# Patient Record
Sex: Male | Born: 1985 | Race: White | Hispanic: No | Marital: Single | Smoking: Never smoker
Health system: Southern US, Community
[De-identification: ages and names within clinical notes are randomized; demographics above are authoritative.]

## PROBLEM LIST (undated history)

## (undated) DIAGNOSIS — F988 Other specified behavioral and emotional disorders with onset usually occurring in childhood and adolescence: Secondary | ICD-10-CM

---

## 2018-02-28 ENCOUNTER — Other Ambulatory Visit: Payer: Self-pay

## 2018-02-28 ENCOUNTER — Emergency Department (HOSPITAL_COMMUNITY): Payer: BLUE CROSS/BLUE SHIELD | Admitting: Certified Registered Nurse Anesthetist

## 2018-02-28 ENCOUNTER — Encounter (HOSPITAL_COMMUNITY): Payer: Self-pay | Admitting: Emergency Medicine

## 2018-02-28 ENCOUNTER — Emergency Department (HOSPITAL_COMMUNITY): Payer: BLUE CROSS/BLUE SHIELD

## 2018-02-28 ENCOUNTER — Ambulatory Visit (HOSPITAL_COMMUNITY)
Admission: EM | Admit: 2018-02-28 | Discharge: 2018-03-01 | Disposition: A | Discharge status: Home / Self Care | Payer: BLUE CROSS/BLUE SHIELD | Attending: Emergency Medicine | Admitting: Emergency Medicine

## 2018-02-28 ENCOUNTER — Encounter (HOSPITAL_COMMUNITY)
Admission: EM | Disposition: A | Discharge status: Home / Self Care | Payer: Self-pay | Source: Home / Self Care | Attending: Emergency Medicine

## 2018-02-28 DIAGNOSIS — S68624A Partial traumatic transphalangeal amputation of right ring finger, initial encounter: Secondary | ICD-10-CM | POA: Insufficient documentation

## 2018-02-28 DIAGNOSIS — S62636B Displaced fracture of distal phalanx of right little finger, initial encounter for open fracture: Secondary | ICD-10-CM | POA: Insufficient documentation

## 2018-02-28 DIAGNOSIS — S62632B Displaced fracture of distal phalanx of right middle finger, initial encounter for open fracture: Secondary | ICD-10-CM | POA: Insufficient documentation

## 2018-02-28 DIAGNOSIS — S62609B Fracture of unspecified phalanx of unspecified finger, initial encounter for open fracture: Secondary | ICD-10-CM | POA: Diagnosis present

## 2018-02-28 DIAGNOSIS — Y9389 Activity, other specified: Secondary | ICD-10-CM | POA: Diagnosis not present

## 2018-02-28 DIAGNOSIS — W3189XA Contact with other specified machinery, initial encounter: Secondary | ICD-10-CM | POA: Diagnosis not present

## 2018-02-28 HISTORY — PX: PERCUTANEOUS PINNING: SHX2209

## 2018-02-28 SURGERY — PINNING, EXTREMITY, PERCUTANEOUS
Anesthesia: General | Laterality: Right

## 2018-02-28 MED ORDER — ROCURONIUM BROMIDE 50 MG/5ML IV SOSY
PREFILLED_SYRINGE | INTRAVENOUS | Status: AC
  Filled 2018-02-28: qty 10

## 2018-02-28 MED ORDER — CEFAZOLIN SODIUM-DEXTROSE 2-4 GM/100ML-% IV SOLN
2.0000 g | Freq: Once | INTRAVENOUS | Status: AC
  Administered 2018-02-28: 2 g via INTRAVENOUS
  Filled 2018-02-28: qty 100

## 2018-02-28 MED ORDER — FENTANYL CITRATE (PF) 250 MCG/5ML IJ SOLN
INTRAMUSCULAR | Status: AC
  Filled 2018-02-28: qty 5

## 2018-02-28 MED ORDER — MIDAZOLAM HCL 5 MG/5ML IJ SOLN
INTRAMUSCULAR | Status: DC | PRN
  Administered 2018-02-28: 2 mg via INTRAVENOUS

## 2018-02-28 MED ORDER — HYDROMORPHONE HCL 1 MG/ML IJ SOLN: 0.2500 mg | INTRAMUSCULAR | Status: DC | PRN

## 2018-02-28 MED ORDER — LIDOCAINE 2% (20 MG/ML) 5 ML SYRINGE
INTRAMUSCULAR | Status: AC
  Filled 2018-02-28: qty 5

## 2018-02-28 MED ORDER — SUCCINYLCHOLINE CHLORIDE 20 MG/ML IJ SOLN
INTRAMUSCULAR | Status: DC | PRN
  Administered 2018-02-28: 140 mg via INTRAVENOUS

## 2018-02-28 MED ORDER — PROPOFOL 10 MG/ML IV BOLUS
INTRAVENOUS | Status: DC | PRN
  Administered 2018-02-28: 160 mg via INTRAVENOUS

## 2018-02-28 MED ORDER — FENTANYL CITRATE (PF) 250 MCG/5ML IJ SOLN
INTRAMUSCULAR | Status: DC | PRN
  Administered 2018-02-28: 100 ug via INTRAVENOUS

## 2018-02-28 MED ORDER — DIPHENHYDRAMINE HCL 50 MG/ML IJ SOLN
INTRAMUSCULAR | Status: DC | PRN
  Administered 2018-02-28: 12.5 mg via INTRAVENOUS

## 2018-02-28 MED ORDER — DEXAMETHASONE SODIUM PHOSPHATE 10 MG/ML IJ SOLN
INTRAMUSCULAR | Status: AC
  Filled 2018-02-28: qty 1

## 2018-02-28 MED ORDER — DEXAMETHASONE SODIUM PHOSPHATE 10 MG/ML IJ SOLN
INTRAMUSCULAR | Status: DC | PRN
  Administered 2018-02-28: 5 mg via INTRAVENOUS

## 2018-02-28 MED ORDER — LACTATED RINGERS IV SOLN
INTRAVENOUS | Status: DC | PRN
  Administered 2018-02-28: 22:00:00 via INTRAVENOUS

## 2018-02-28 MED ORDER — MORPHINE SULFATE (PF) 4 MG/ML IV SOLN
4.0000 mg | Freq: Once | INTRAVENOUS | Status: AC
  Administered 2018-02-28: 4 mg via INTRAVENOUS
  Filled 2018-02-28: qty 1

## 2018-02-28 MED ORDER — MEPERIDINE HCL 50 MG/ML IJ SOLN: 6.2500 mg | INTRAMUSCULAR | Status: DC | PRN

## 2018-02-28 MED ORDER — BUPIVACAINE HCL (PF) 0.25 % IJ SOLN
INTRAMUSCULAR | Status: AC
  Filled 2018-02-28: qty 30

## 2018-02-28 MED ORDER — MIDAZOLAM HCL 2 MG/2ML IJ SOLN
INTRAMUSCULAR | Status: AC
  Filled 2018-02-28: qty 2

## 2018-02-28 MED ORDER — EPHEDRINE 5 MG/ML INJ
INTRAVENOUS | Status: AC
  Filled 2018-02-28: qty 10

## 2018-02-28 MED ORDER — TETANUS-DIPHTH-ACELL PERTUSSIS 5-2.5-18.5 LF-MCG/0.5 IM SUSP
0.5000 mL | Freq: Once | INTRAMUSCULAR | Status: AC
  Administered 2018-02-28: 0.5 mL via INTRAMUSCULAR
  Filled 2018-02-28: qty 0.5

## 2018-02-28 MED ORDER — PROPOFOL 10 MG/ML IV BOLUS
INTRAVENOUS | Status: AC
  Filled 2018-02-28: qty 20

## 2018-02-28 MED ORDER — ONDANSETRON HCL 4 MG/2ML IJ SOLN
INTRAMUSCULAR | Status: DC | PRN
  Administered 2018-02-28: 4 mg via INTRAVENOUS

## 2018-02-28 MED ORDER — LACTATED RINGERS IV SOLN: INTRAVENOUS | Status: DC

## 2018-02-28 MED ORDER — 0.9 % SODIUM CHLORIDE (POUR BTL) OPTIME
TOPICAL | Status: DC | PRN
  Administered 2018-02-28: 1000 mL

## 2018-02-28 MED ORDER — SODIUM CHLORIDE 0.9 % IV BOLUS
500.0000 mL | Freq: Once | INTRAVENOUS | Status: AC
  Administered 2018-02-28: 500 mL via INTRAVENOUS

## 2018-02-28 MED ORDER — PROMETHAZINE HCL 25 MG/ML IJ SOLN: 6.2500 mg | INTRAMUSCULAR | Status: DC | PRN

## 2018-02-28 MED ORDER — PHENYLEPHRINE 40 MCG/ML (10ML) SYRINGE FOR IV PUSH (FOR BLOOD PRESSURE SUPPORT)
PREFILLED_SYRINGE | INTRAVENOUS | Status: AC
  Filled 2018-02-28: qty 10

## 2018-02-28 SURGICAL SUPPLY — 36 items
BANDAGE ACE 4X5 VEL STRL LF (GAUZE/BANDAGES/DRESSINGS) ×3 IMPLANT
BNDG CMPR 9X4 STRL LF SNTH (GAUZE/BANDAGES/DRESSINGS)
BNDG COHESIVE 1X5 TAN STRL LF (GAUZE/BANDAGES/DRESSINGS) ×3 IMPLANT
BNDG ESMARK 4X9 LF (GAUZE/BANDAGES/DRESSINGS) IMPLANT
BNDG GAUZE ELAST 4 BULKY (GAUZE/BANDAGES/DRESSINGS) ×15 IMPLANT
CORDS BIPOLAR (ELECTRODE) IMPLANT
CUFF TOURNIQUET SINGLE 18IN (TOURNIQUET CUFF) ×3 IMPLANT
CUFF TOURNIQUET SINGLE 24IN (TOURNIQUET CUFF) IMPLANT
DRAPE SURG 17X23 STRL (DRAPES) ×3 IMPLANT
GAUZE SPONGE 4X4 12PLY STRL (GAUZE/BANDAGES/DRESSINGS) ×9 IMPLANT
GAUZE SPONGE 4X4 12PLY STRL LF (GAUZE/BANDAGES/DRESSINGS) ×3 IMPLANT
GAUZE XEROFORM 1X8 LF (GAUZE/BANDAGES/DRESSINGS) ×3 IMPLANT
GLOVE SURG SYN 8.0 (GLOVE) ×3 IMPLANT
GOWN STRL REUS W/ TWL LRG LVL3 (GOWN DISPOSABLE) ×1 IMPLANT
GOWN STRL REUS W/ TWL XL LVL3 (GOWN DISPOSABLE) ×1 IMPLANT
GOWN STRL REUS W/TWL LRG LVL3 (GOWN DISPOSABLE) ×3
GOWN STRL REUS W/TWL XL LVL3 (GOWN DISPOSABLE) ×3
K-WIRE DBL END TROCAR 6X.062 (WIRE) ×3
K-WIRE FX150X2X2 TROC KRSH (WIRE) ×1
K-WIRE SMTH NS 9X.035 (WIRE) ×3 IMPLANT
K-WIRE TROCAR 2X150 (WIRE) ×3
KIT BASIN OR (CUSTOM PROCEDURE TRAY) ×3 IMPLANT
KIT TURNOVER KIT B (KITS) ×3 IMPLANT
KWIRE DBL END TROCAR 6X.062 (WIRE) ×1 IMPLANT
KWIRE FX150X2X2 TROC KRSH (WIRE) ×1 IMPLANT
NEEDLE HYPO 25GX1X1/2 BEV (NEEDLE) IMPLANT
NS IRRIG 1000ML POUR BTL (IV SOLUTION) ×3 IMPLANT
PACK ORTHO EXTREMITY (CUSTOM PROCEDURE TRAY) ×3 IMPLANT
PAD ARMBOARD 7.5X6 YLW CONV (MISCELLANEOUS) ×6 IMPLANT
PAD CAST 4YDX4 CTTN HI CHSV (CAST SUPPLIES) ×2 IMPLANT
PADDING CAST COTTON 4X4 STRL (CAST SUPPLIES) ×6
SYR CONTROL 10ML LL (SYRINGE) ×3 IMPLANT
TOWEL OR 17X24 6PK STRL BLUE (TOWEL DISPOSABLE) ×3 IMPLANT
TOWEL OR 17X26 10 PK STRL BLUE (TOWEL DISPOSABLE) ×3 IMPLANT
UNDERPAD 30X30 (UNDERPADS AND DIAPERS) ×3 IMPLANT
YANKAUER SUCT BULB TIP NO VENT (SUCTIONS) ×3 IMPLANT

## 2018-02-28 NOTE — ED Triage Notes (Signed)
Pt arrives to ED from home with complaints of lac to right fingers. (middle, ring, pinky fingers) from a lawn mower. EMS reports the pt was cleaning the shoot on the lawn mower with the engine turned on. Per Ems, the pts fingers arte mangled but nothing amputated. The pt put his hand in a bowl of ice when the accident happened. Bleeding is controlled. Pt placed in position of comfort with bed locked and lowered, call bell in reach.

## 2018-02-28 NOTE — Op Note (Signed)
Please see dictated report [phone]

## 2018-02-28 NOTE — Anesthesia Procedure Notes (Addendum)
Procedure Name: Intubation Date/Time: 02/28/2018 10:31 PM Performed by: Myna Bright, CRNA Pre-anesthesia Checklist: Patient identified, Emergency Drugs available, Suction available and Patient being monitored Patient Re-evaluated:Patient Re-evaluated prior to induction Oxygen Delivery Method: Circle System Utilized Preoxygenation: Pre-oxygenation with 100% oxygen Induction Type: IV induction Ventilation: Mask ventilation without difficulty Laryngoscope Size: Mac and 4 Grade View: Grade I Tube type: Oral Tube size: 7.5 mm Number of attempts: 1 Airway Equipment and Method: Stylet and Oral airway Placement Confirmation: ETT inserted through vocal cords under direct vision,  positive ETCO2 and breath sounds checked- equal and bilateral Secured at: 22 cm Tube secured with: Tape Dental Injury: Teeth and Oropharynx as per pre-operative assessment

## 2018-02-28 NOTE — Consult Note (Signed)
Reason for Consult:right hand lawnmower injury Referring Physician: long  Damon Dunlap is an 32 y.o. male.  HPI: patient's a very pleasant 32 year old right-hand-dominant male who placed his hand in the chute of a lawnmower while running and presents with obvious open injuries to the right long, ring, and small fingers.  History reviewed. No pertinent past medical history.  History reviewed. No pertinent surgical history.  No family history on file.  Social History:  has no tobacco, alcohol, and drug history on file.  Allergies: No Known Allergies  Medications: Scheduled:   No results found for this or any previous visit (from the past 48 hour(s)).  Dg Hand Complete Right  Result Date: 02/28/2018 CLINICAL DATA:  Lawnmower injury.  Finger lacerations. EXAM: RIGHT HAND - COMPLETE 3+ VIEW COMPARISON:  None. FINDINGS: There are partial amputation injuries through the 3rd, 4th and 5th digits. The long finger injury involves the base of the distal phalanx and demonstrates intra-articular extension with resulting anterior dislocation. The middle phalanx appears intact. The ring finger injury involves the head of the middle phalanx and the distal phalanx. The small finger injury involves only the tip of the distal phalanx. There is significant soft tissue injury, especially in the ring finger. No foreign bodies are seen. IMPRESSION: Partial amputation injuries of the 3rd, 4th and 5th digits as described, most severe in the ring finger. Electronically Signed   By: Carey BullocksWilliam  Veazey M.D.   On: 02/28/2018 19:28    Review of Systems  All other systems reviewed and are negative.  Blood pressure 117/79, pulse 61, temperature 97.8 F (36.6 C), temperature source Oral, resp. rate 16, height 5\' 10"  (1.778 m), weight 72.6 kg, SpO2 100 %. Physical Exam  Constitutional: He appears well-developed and well-nourished.  HENT:  Head: Normocephalic and atraumatic.  Neck: Normal range of motion.   Cardiovascular: Normal rate.  Respiratory: Effort normal.  Musculoskeletal:       Right hand: He exhibits tenderness, bony tenderness, disruption of two-point discrimination, deformity and laceration.  Right hand complex open injuries involving the long, ring, and small fingers with open distal phalangeal fractures and deformity  Skin: Skin is warm.  Psychiatric: He has a normal mood and affect. His behavior is normal. Judgment and thought content normal.    Assessment/Plan: 32 year old right-hand-dominant male with complex open injuries to his right long, ring, and small fingers status post lawnmower accident. I discussed with the patient the need for exploration and repair of necessary structures as an outpatient as soon as possible. Patient understands the risks and benefits and wishes to proceed.  Artist PaisMatthew A [facility] LibertyWeingold 02/28/2018, 8:42 PM

## 2018-02-28 NOTE — Anesthesia Preprocedure Evaluation (Addendum)
Anesthesia Evaluation  Patient identified by MRN, date of birth, ID band Patient awake    Reviewed: Allergy & Precautions, NPO status , Patient's Chart, lab work & pertinent test results  Airway Mallampati: II  TM Distance: >3 FB Neck ROM: Full    Dental  (+) Teeth Intact, Dental Advisory Given   Pulmonary neg pulmonary ROS,    breath sounds clear to auscultation       Cardiovascular negative cardio ROS   Rhythm:Regular Rate:Normal     Neuro/Psych negative neurological ROS  negative psych ROS   GI/Hepatic negative GI ROS, Neg liver ROS,   Endo/Other  negative endocrine ROS  Renal/GU negative Renal ROS     Musculoskeletal negative musculoskeletal ROS (+)   Abdominal Normal abdominal exam  (+)   Peds  Hematology negative hematology ROS (+)   Anesthesia Other Findings   Reproductive/Obstetrics                            Anesthesia Physical Anesthesia Plan  ASA: II  Anesthesia Plan: General   Post-op Pain Management:    Induction: Intravenous, Rapid sequence and Cricoid pressure planned  PONV Risk Score and Plan: 3 and Ondansetron, Dexamethasone and Midazolam  Airway Management Planned: Oral ETT  Additional Equipment: None  Intra-op Plan:   Post-operative Plan: Extubation in OR  Informed Consent: I have reviewed the patients History and Physical, chart, labs and discussed the procedure including the risks, benefits and alternatives for the proposed anesthesia with the patient or authorized representative who has indicated his/her understanding and acceptance.   Dental advisory given  Plan Discussed with: CRNA  Anesthesia Plan Comments:        Anesthesia Quick Evaluation

## 2018-02-28 NOTE — ED Provider Notes (Signed)
[facility] Provider Note   CSN: [id number] Arrival date & time: 02/28/18  1828     History   Chief Complaint Chief Complaint  Patient presents with  . Laceration    HPI Damon Dunlap is a [age] y.o. male presenting today for lacerations to his third fourth and fifth digits of his right hand.  Patient states that approximately 5 PM he was mowing and attempted to remove debris stuck in the chute while the motor was running.  Patient states that pain was immediate, sharp, constant and 10/10 in severity.  Patient called EMS who arrived and applied direct pressure to the wounds.  Bleeding controlled on arrival to the department.  Of note patient is right-hand dominant. Patient states last oral intake was approximately 2 PM today.   HPI  History reviewed. No pertinent past medical history.  There are no active problems to display for this patient.   History reviewed. No pertinent surgical history.      Home Medications    Prior to Admission medications   Not on File    Family History History reviewed. No pertinent family history.  Social History Social History   Tobacco Use  . Smoking status: Not on file  Substance Use Topics  . Alcohol use: Not on file  . Drug use: Not on file     Allergies   Patient has no known allergies.   Review of Systems Review of Systems  Constitutional: Negative.  Negative for chills and fever.  Gastrointestinal: Negative.  Negative for abdominal pain, blood in stool, diarrhea, nausea and vomiting.  Skin: Positive for wound.  Neurological: Positive for numbness. Negative for dizziness, weakness and headaches.  All other systems reviewed and are negative.    Physical Exam Updated Vital Signs BP 124/83   Pulse 72   Temp 97.8 F (36.6 C) (Oral)   Resp 14   Ht 5\' 10"  (1.778 m)   Wt 72.6 kg   SpO2 100%   BMI 22.96 kg/m   Physical Exam  Constitutional: He is oriented to person, place,  and time. He appears well-developed and well-nourished. No distress.  HENT:  Head: Normocephalic and atraumatic.  Right Ear: External ear normal.  Left Ear: External ear normal.  Nose: Nose normal.  Eyes: Pupils are equal, round, and reactive to light. EOM are normal.  Neck: Trachea normal and normal range of motion. No tracheal deviation present.  Cardiovascular: Normal rate, regular rhythm, normal heart sounds and intact distal pulses.  Pulmonary/Chest: Effort normal. No respiratory distress.  Abdominal: Soft. There is no tenderness. There is no rebound and no guarding.  Musculoskeletal: He exhibits deformity.  Patient with extensive injuries to the third/fourth/fifth fingers of the right hand as pictured below.  Sensation intact to third and fifth finger.  Patient describes numbness and tingling to the tip of the fourth finger, no sensation to light touch at the tip of fourth finger.  Patient is unwilling to move fingers due to pain.  Neurological: He is alert and oriented to person, place, and time.  Skin: Skin is warm and dry.  Psychiatric: He has a normal mood and affect. His behavior is normal.         ED Treatments / Results  Labs (all labs ordered are listed, but only abnormal results are displayed) Labs Reviewed - No data to display  EKG None  Radiology Dg Hand Complete Right  Result Date: 02/28/2018 CLINICAL DATA:  Lawnmower injury.  Finger lacerations.  EXAM: RIGHT HAND - COMPLETE 3+ VIEW COMPARISON:  None. FINDINGS: There are partial amputation injuries through the 3rd, 4th and 5th digits. The long finger injury involves the base of the distal phalanx and demonstrates intra-articular extension with resulting anterior dislocation. The middle phalanx appears intact. The ring finger injury involves the head of the middle phalanx and the distal phalanx. The small finger injury involves only the tip of the distal phalanx. There is significant soft tissue injury,  especially in the ring finger. No foreign bodies are seen. IMPRESSION: Partial amputation injuries of the 3rd, 4th and 5th digits as described, most severe in the ring finger. Electronically Signed   By: Carey BullocksWilliam  Veazey M.D.   On: 02/28/2018 19:28    Procedures Procedures (including critical care time)  Medications Ordered in ED Medications  morphine 4 MG/ML injection 4 mg (4 mg Intravenous Given 02/28/18 1908)  sodium chloride 0.9 % bolus 500 mL (0 mLs Intravenous Stopped 02/28/18 2008)  morphine 4 MG/ML injection 4 mg (4 mg Intravenous Given 02/28/18 2002)  ceFAZolin (ANCEF) IVPB 2g/100 mL premix (0 g Intravenous Stopped 02/28/18 2139)  Tdap (BOOSTRIX) injection 0.5 mL (0.5 mLs Intramuscular Given 02/28/18 2108)     Initial Impression / Assessment and Plan / ED Course  I have reviewed the triage vital signs and the nursing notes.  Pertinent labs & imaging results that were available during my care of the patient were reviewed by me and considered in my medical decision making (see chart for details).  Clinical Course as of Feb 28 2153  Fri Feb 28, 2018  2016 Consult called to hand, Dr. Mina MarbleWeingold, who is coming to evaluate the patient in the emergency department.   [BM]    Clinical Course User Index [BM] Bill SalinasMorelli, Brandon A, PA-C   Patient with open fractures to the third fourth and fifth fingers of the right hand.  Consult called to Dr. Mina MarbleWeingold who is taking the patient up to the OR for repair. Patient states that his last tetanus shot was over 15 years ago, Tdap provided here in department. 2 g of Ancef begun here in the emergency department. Patient states his last oral intake was at 2 PM today. Pain control and IV fluids provided in the emergency department.  Patient awaiting transport to OR.   Final Clinical Impressions(s) / ED Diagnoses   Final diagnoses:  Open fracture dislocation of distal interphalangeal (DIP) joint of finger    ED Discharge Orders    None         Elizabeth PalauMorelli, Brandon A, PA-C 02/28/18 2155    Maia PlanLong, Joshua G, MD 02/28/18 2332

## 2018-02-28 NOTE — ED Notes (Signed)
EMS gave 200 mcg fentanyl.  

## 2018-02-28 NOTE — ED Notes (Signed)
Pt uncle, Karis JubaRichard Alvarenga, for Emergency Contact, and to contact him when surgery is over, per pt.   [phone]

## 2018-03-01 ENCOUNTER — Encounter (HOSPITAL_COMMUNITY): Payer: Self-pay | Admitting: Orthopedic Surgery

## 2018-03-01 MED ORDER — BUPIVACAINE HCL (PF) 0.25 % IJ SOLN
INTRAMUSCULAR | Status: DC | PRN
  Administered 2018-02-28: 9 mL

## 2018-03-01 MED ORDER — AMOXICILLIN-POT CLAVULANATE 875-125 MG PO TABS: 1.0000 | ORAL_TABLET | Freq: Two times a day (BID) | ORAL | 0 refills | Status: AC

## 2018-03-01 MED ORDER — OXYCODONE-ACETAMINOPHEN 5-325 MG PO TABS: 1.0000 | ORAL_TABLET | ORAL | 0 refills | Status: DC | PRN

## 2018-03-01 NOTE — Transfer of Care (Signed)
Immediate Anesthesia Transfer of Care Note  Patient: Damon LeveringReece Dunlap  Procedure(s) Performed: REPAIR RIGHT OPEN FRACTURES, LONG AND SMALL FINGER, PLUS ANY NECESSARY PROCEDURES (Right )  Patient Location: PACU  Anesthesia Type:General  Level of Consciousness: drowsy  Airway & Oxygen Therapy: Patient Spontanous Breathing and Patient connected to nasal cannula oxygen  Post-op Assessment: Report given to RN and Post -op Vital signs reviewed and stable  Post vital signs: Reviewed and stable  Last Vitals:  Vitals Value Taken Time  BP 98/81 03/01/2018 12:02 AM  Temp    Pulse 79 03/01/2018 12:03 AM  Resp 15 03/01/2018 12:03 AM  SpO2 97 % 03/01/2018 12:03 AM  Vitals shown include unvalidated device data.  Last Pain:  Vitals:   02/28/18 2142  TempSrc:   PainSc: 3          Complications: No apparent anesthesia complications

## 2018-03-01 NOTE — Anesthesia Postprocedure Evaluation (Signed)
Anesthesia Post Note  Patient: [name] [name] Procedure(s) Performed: REPAIR RIGHT OPEN FRACTURES, LONG AND SMALL FINGER, PLUS ANY NECESSARY PROCEDURES (Right )     Patient location during evaluation: PACU Anesthesia Type: General Level of consciousness: awake and alert Pain management: pain level controlled Vital Signs Assessment: post-procedure vital signs reviewed and stable Respiratory status: spontaneous breathing, nonlabored ventilation, respiratory function stable and patient connected to nasal cannula oxygen Cardiovascular status: blood pressure returned to baseline and stable Postop Assessment: no apparent nausea or vomiting Anesthetic complications: no    Last Vitals:  Vitals:   03/01/18 0030 03/01/18 0045  BP: 129/67 119/73  Pulse: 83 88  Resp: 13 18  Temp:  36.6 C  SpO2: 100% 97%    Last Pain:  Vitals:   03/01/18 0045  TempSrc:   PainSc: 0-No pain                 Shelton SilvasKevin D Draxton Luu

## 2018-03-01 NOTE — Op Note (Signed)
NAMCarlos Dunlap: Rehm, Kross MEDICAL RECORD [id number] ACCOUNT [id number] DATE OF BIRTH:06-20-1986 FACILITY: [facility] LOCATION: [facility] PHYSICIAN:Braydee Shimkus A. Mina MarbleWEINGOLD, MD  OPERATIVE REPORT  DATE OF PROCEDURE:  02/28/2018  PREOPERATIVE DIAGNOSES:  Complex injuries right long, ring and small fingers.  POSTOPERATIVE DIAGNOSES:  Complex injuries right long, ring and small fingers.  PROCEDURE: 1.  Right long finger open treatment fracture dislocation distal interphalangeal joint with extensor tendon repair to his right ring finger. 2.  Middle phalangeal level amputation with fillet flap. 3.  Right small finger distal phalanx open fracture treatment.  SURGEON:  Dairl PonderMatthew Ryosuke Ericksen, MD  ASSISTANT:  None.  ANESTHESIA:  General.  COMPLICATIONS:  None.  DRAINS:  None.  DESCRIPTION OF PROCEDURE:  The patient was taken to the operating suite after induction of adequate general anesthetic.  The right upper extremity was prepped and draped in the usual sterile fashion.  An Esmarch was used to exsanguinate the limb, and the  tourniquet was inflated to 250 mmHg.  At this point in time, we thoroughly irrigated the long, ring and small fingers of the right hand after a complex open ____ injury.  One liter of normal saline was used to irrigate.  We removed bits of grass from  the wounds.  We then inspected the wounds.  The right long finger had a fracture dislocation of the distal interphalangeal joint that was open, as well as extensor tendon laceration proximally.  We thoroughly removed all nonviable foreign material from  the open fracture dislocation.  We reduced it, and we then sutured the skin edges with 4-0 Monocryl.  The extensor tendon was repaired with the same suture, and then under direct and fluoroscopic imaging, we placed a 0.035 K wire across the distal  phalanx into the middle phalanx to reduce the fracture dislocation.  The K wire was cut outside the skin.  The ring finger had a complex open  injury with comminution of the head of the middle phalanx and the base of the distal phalanx with a complex nail  bed laceration with soft tissue loss palmarly and over the germinal and sterile matrices.  We carefully excised the osseous fragments remaining in the nail bed to create a fillet flap that was used to advance palmarly over the tip of the ring finger.   We used a rongeur to smooth out the distal aspect of the middle phalanx.  We then performed primary wound closure of this fillet flap using 4-0 Monocryl.  The small finger had an open fracture of the distal phalanx, so we irrigated and treated with a  closed reduction.  We sutured the skin edges with 4-0 Monocryl.  At the end of the procedure, we dressed the wounds with Xeroform, 4 x 4's and compression wraps.  The patient tolerated these procedures well and went to recovery room in stable fashion.  LN/NUANCE  D:03/01/2018 T:03/01/2018 JOB:[id number]

## 2018-08-12 ENCOUNTER — Ambulatory Visit
Admission: EM | Admit: 2018-08-12 | Discharge: 2018-08-12 | Disposition: A | Discharge status: Home / Self Care | Payer: BLUE CROSS/BLUE SHIELD | Attending: Nurse Practitioner | Admitting: Nurse Practitioner

## 2018-08-12 DIAGNOSIS — H6982 Other specified disorders of Eustachian tube, left ear: Secondary | ICD-10-CM | POA: Insufficient documentation

## 2018-08-12 MED ORDER — FLUTICASONE PROPIONATE 50 MCG/ACT NA SUSP: 1.0000 | Freq: Every day | NASAL | 2 refills | Status: DC

## 2018-08-12 MED ORDER — CETIRIZINE HCL 10 MG PO TABS: 10.0000 mg | ORAL_TABLET | Freq: Every day | ORAL | 0 refills | Status: DC

## 2018-08-12 NOTE — ED Triage Notes (Signed)
Pt c/o lt ear pain and fullness for the past month

## 2018-08-12 NOTE — ED Provider Notes (Signed)
[facility]    CSN: [id number] Arrival date & time: 08/12/18  1451     History   Chief Complaint Chief Complaint  Patient presents with  . Otalgia    HPI Damon Dunlap is a 33 y.o. male.   Subjective:   Damon Dunlap is a 33 y.o. male who presents for left ear complaints. Symptoms include plugged sensation in the left ear and decreased hearing. Onset of symptoms was 1 month ago, unchanged since that time. Associated symptoms include left ear pressure.  He denies any ear pain or URI type symptoms.  He states that this happens from time to time when he is buildup of wax.  He had to have his ears cleaned out about a year ago.    The following portions of the patient's history were reviewed and updated as appropriate: allergies, current medications, past family history, past medical history, past social history, past surgical history and problem list.           History reviewed. No pertinent past medical history.  There are no active problems to display for this patient.   Past Surgical History:  Procedure Laterality Date  . PERCUTANEOUS PINNING Right 02/28/2018   Procedure: REPAIR RIGHT OPEN FRACTURES, LONG AND SMALL FINGER, PLUS ANY NECESSARY PROCEDURES;  Surgeon: Dairl Ponder, MD;  Location: [facility] OR;  Service: Orthopedics;  Laterality: Right;       Home Medications    Prior to Admission medications   Medication Sig Start Date End Date Taking? Authorizing Provider  cetirizine (ZYRTEC) 10 MG tablet Take 1 tablet (10 mg total) by mouth daily. 08/12/18   Lurline Idol, FNP  fluticasone (FLONASE) 50 MCG/ACT nasal spray Place 1 spray into both nostrils daily. 08/12/18   Lurline Idol, FNP    Family History No family history on file.  Social History Social History   Tobacco Use  . Smoking status: Never Smoker  . Smokeless tobacco: Never Used  Substance Use Topics  . Alcohol use: Not Currently  . Drug use: Not on file     Allergies     Patient has no known allergies.   Review of Systems Review of Systems  Constitutional: Negative.   HENT: Negative for ear discharge and ear pain.   Eyes: Negative.   Respiratory: Negative.   Cardiovascular: Negative.   All other systems reviewed and are negative.    Physical Exam Triage Vital Signs ED Triage Vitals [08/12/18 1500]  Enc Vitals Group     BP 126/81     Pulse Rate (!) 103     Resp 18     Temp 97.8 F (36.6 C)     Temp Source Oral     SpO2 97 %     Weight      Height      Head Circumference      Peak Flow      Pain Score 2     Pain Loc      Pain Edu?      Excl. in GC?    No data found.  Updated Vital Signs BP 126/81 (BP Location: Left Arm)   Pulse (!) 103   Temp 97.8 F (36.6 C) (Oral)   Resp 18   SpO2 97%   Visual Acuity Right Eye Distance:   Left Eye Distance:   Bilateral Distance:    Right Eye Near:   Left Eye Near:    Bilateral Near:     Physical Exam Constitutional:  Appearance: Normal appearance.  HENT:     Head: Normocephalic.     Right Ear: Hearing, tympanic membrane, ear canal and external ear normal.     Left Ear: Hearing, tympanic membrane, ear canal and external ear normal.     Nose: Nose normal.     Mouth/Throat:     Mouth: Mucous membranes are moist.     Pharynx: Oropharynx is clear.  Eyes:     Pupils: Pupils are equal, round, and reactive to light.  Neck:     Musculoskeletal: Normal range of motion and neck supple.  Cardiovascular:     Rate and Rhythm: Normal rate.  Pulmonary:     Effort: Pulmonary effort is normal.  Musculoskeletal: Normal range of motion.  Skin:    General: Skin is warm and dry.  Neurological:     General: No focal deficit present.     Mental Status: He is alert and oriented to person, place, and time.  Psychiatric:        Mood and Affect: Mood normal.      UC Treatments / Results  Labs (all labs ordered are listed, but only abnormal results are displayed) Labs Reviewed - No data  to display  EKG None  Radiology No results found.  Procedures Ear Cerumen Removal Date/Time: 08/12/2018 3:37 PM Performed by: Kallie LocksSchema, David W, RT Authorized by: Lurline IdolMurrill, Wyona Neils, FNP   Consent:    Consent obtained:  Verbal   Consent given by:  Patient   Risks discussed:  Bleeding, infection, pain, dizziness, incomplete removal and TM perforation   Alternatives discussed:  No treatment Procedure details:    Location:  R ear   Procedure type: irrigation   Post-procedure details:    Inspection:  TM intact   Hearing quality:  Normal   Patient tolerance of procedure:  Tolerated well, no immediate complications Comments:     Not much cerumen was there to be removed.  Patient felt that washing his ear out might help his symptoms so we obliged.   (including critical care time)  Medications Ordered in UC Medications - No data to display  Initial Impression / Assessment and Plan / UC Course  I have reviewed the triage vital signs and the nursing notes.  Pertinent labs & imaging results that were available during my care of the patient were reviewed by me and considered in my medical decision making (see chart for details).    33 year old male presenting with a one-month history of feeling as if his left ear is plugged with some pressure and decreased hearing.  Denies any other symptoms.  ENT exam relatively unremarkable.  Hearing is intact.  No cerumen impaction or buildup noted.  Patient ear was irrigated without any significant improvement in his symptoms.  Will treat for Eustachian tube dysfunction with Zyrtec and Flonase.  Advised to follow-up with ENT if symptoms persist or worsen.  Today's evaluation has revealed no signs of a dangerous process. Discussed diagnosis with patient. Patient aware of their diagnosis, possible red flag symptoms to watch out for and need for close follow up. Patient understands verbal and written discharge instructions. Patient comfortable with plan  and disposition.  Patient has a clear mental status at this time, good insight into illness (after discussion and teaching) and has clear judgment to make decisions regarding their care.  Documentation was completed with the aid of voice recognition software. Transcription may contain typographical errors. Final Clinical Impressions(s) / UC Diagnoses   Final diagnoses:  Eustachian tube  dysfunction, left     Discharge Instructions     Take medications as prescribed.  Follow-up with ENT as needed.    ED Prescriptions    Medication Sig Dispense Auth. Provider   fluticasone (FLONASE) 50 MCG/ACT nasal spray Place 1 spray into both nostrils daily. 16 g Lurline IdolMurrill, Rebecka Oelkers, FNP   cetirizine (ZYRTEC) 10 MG tablet Take 1 tablet (10 mg total) by mouth daily. 30 tablet Lurline IdolMurrill, Zebulin Siegel, FNP     Controlled Substance Prescriptions [place] Controlled Substance Registry consulted? Not Applicable   Lurline IdolMurrill, Aralynn Brake, [place]NP 08/12/18 1542

## 2018-08-12 NOTE — Discharge Instructions (Addendum)
Take medications as prescribed.  Follow-up with ENT as needed.

## 2018-11-17 ENCOUNTER — Emergency Department
Admission: EM | Admit: 2018-11-17 | Discharge: 2018-11-17 | Discharge status: Against Medical Advice | Payer: BLUE CROSS/BLUE SHIELD | Attending: Student in an Organized Health Care Education/Training Program | Admitting: Student in an Organized Health Care Education/Training Program

## 2018-11-17 ENCOUNTER — Encounter: Payer: Self-pay | Admitting: Emergency Medicine

## 2018-11-17 ENCOUNTER — Other Ambulatory Visit: Payer: Self-pay

## 2018-11-17 DIAGNOSIS — F909 Attention-deficit hyperactivity disorder, unspecified type: Secondary | ICD-10-CM | POA: Diagnosis not present

## 2018-11-17 DIAGNOSIS — R300 Dysuria: Secondary | ICD-10-CM

## 2018-11-17 DIAGNOSIS — Z79899 Other long term (current) drug therapy: Secondary | ICD-10-CM | POA: Insufficient documentation

## 2018-11-17 HISTORY — DX: Other specified behavioral and emotional disorders with onset usually occurring in childhood and adolescence: F98.8

## 2018-11-17 MED ORDER — DIAZEPAM 5 MG PO TABS
5.0000 mg | ORAL_TABLET | Freq: Once | ORAL | Status: AC
  Administered 2018-11-17: 5 mg via ORAL
  Filled 2018-11-17: qty 1

## 2018-11-17 NOTE — ED Triage Notes (Addendum)
Pt says he was laying in his bed and felt like "my bladder ruptured"; denies pain; voiding normal amount; denies frank blood in urine; denies penile discharge; pt adds he's nervous; was voiding before arrival and felt like something was running down his leg but there wasn't anything there; on the way here he felt like he was incontinent but was not

## 2018-11-17 NOTE — ED Triage Notes (Signed)
First nurse note: Pt walks briskly into speak with the police officer, states " I think my bladder ruptured, I think I peed myself, but my pants are dry"  Pt states to fist nurse that "i've been sitting on the toilet for the past hour and only a small amount came out"  Pt is unable to stand still, and is looking around frequently

## 2018-11-17 NOTE — Discharge Instructions (Addendum)
Please return to the ER or seek medical attention if you have recurrent symptoms or you change your mind about further evaluation.

## 2018-11-17 NOTE — ED Provider Notes (Signed)
[facility] Emergency Department Provider Note    First MD Initiated Contact with Patient 11/17/18 0148     (approximate)  I have reviewed the triage vital signs and the nursing notes.   HISTORY  Chief Complaint bladder issues    HPI Damon Dunlap is a 33 y.o. male below listed past medical history presents the ER for evaluation of difficulty emptying bladder and feeling that he wet himself.  Patient arrives very anxious and appears to be having panic attack.  Denies any hallucinations or use of street drugs.  States he feels like he is "psyching himself up" and currently apologizing for coming to the ED.  Denies any intent to harm himself or others.  Has had some discomfort with emptying his bladder which is new.  Denies any discharge.  No flank pain.  No chest pain or shortness of breath. No nausea or vomiting.  No back pain, numbness or tingling.  No headaches, neck pain, rash, or other symptoms noted.    Past Medical History:  Diagnosis Date  . ADD (attention deficit disorder)    History reviewed. No pertinent family history. Past Surgical History:  Procedure Laterality Date  . PERCUTANEOUS PINNING Right 02/28/2018   Procedure: REPAIR RIGHT OPEN FRACTURES, LONG AND SMALL FINGER, PLUS ANY NECESSARY PROCEDURES;  Surgeon: Dairl Ponder, MD;  Location: [facility] OR;  Service: Orthopedics;  Laterality: Right;   There are no active problems to display for this patient.     Prior to Admission medications   Medication Sig Start Date End Date Taking? Authorizing Provider  amphetamine-dextroamphetamine (ADDERALL) 20 MG tablet Take 20 mg by mouth 3 (three) times daily.   Yes [provider]  loratadine (CLARITIN) 10 MG tablet Take 10 mg by mouth daily.   Yes [provider]    Allergies Patient has no known allergies.    Social History Social History   Tobacco Use  . Smoking status: Never Smoker  . Smokeless tobacco: Never Used  .  Tobacco comment: "just quit vaping"  Substance Use Topics  . Alcohol use: Yes  . Drug use: Never    Review of Systems Patient denies headaches, rhinorrhea, blurry vision, numbness, shortness of breath, chest pain, edema, cough, abdominal pain, nausea, vomiting, diarrhea, dysuria, fevers, rashes or hallucinations unless otherwise stated above in HPI. ____________________________________________   PHYSICAL EXAM:  VITAL SIGNS: Vitals:   11/17/18 0540 11/17/18 0541  BP: (!) 141/85   Pulse:  93  Resp:    Temp:    SpO2:  100%    Constitutional: Alert and oriented.  anxious appearing but pleasant and cooperative Eyes: Conjunctivae are normal. EOMI   Horizontal nystagmus (reportedly chronic per patient) Head: Atraumatic. Nose: No congestion/rhinnorhea. Mouth/Throat: Mucous membranes are moist.   Neck: No stridor. Painless ROM.  Cardiovascular: Normal rate, regular rhythm. Grossly normal heart sounds.  Good peripheral circulation. Respiratory: Normal respiratory effort.  No retractions. Lungs CTAB. Gastrointestinal: Soft and nontender. No distention. No abdominal bruits. No CVA tenderness. Genitourinary: no bladder distention  Normal external genitalia, no FB, no lesions or vesicles, no discharge, no hernia noted, no saddle anesthesia Musculoskeletal: No lower extremity tenderness nor edema.  No joint effusions. Neurologic:  Normal speech and language. No gross focal neurologic deficits are appreciated. No facial droop Skin:  Skin is warm, dry and intact. No rash noted. Psychiatric: Mood and affect are anxious. Speech and behavior are normal.  ____________________________________________   LABS (all labs ordered are listed, but only abnormal results  are displayed)  No results found for this or any previous visit (from the past 24 hour(s)). ____________________________________________ ____________________________________________  RADIOLOGY    ____________________________________________   PROCEDURES  Procedure(s) performed:  Procedures    Critical Care performed: no ____________________________________________   INITIAL IMPRESSION / ASSESSMENT AND PLAN / ED COURSE  Pertinent labs & imaging results that were available during my care of the patient were reviewed by me and considered in my medical decision making (see chart for details).   DDX: bladder spasm, uti, urethritis, panic attack, substance abuse  Damon Dunlap is a 33 y.o. who presents to the ED with above.  Patient anxious appearing but pleasant and cooperative.  He denies any substance ingestion.  States that he is feeling better now but apologetic.  I recommended urinalysis and further evaluation.  Patient declines any blood work.  I did recommend the patient he had a dose of anxiolytic because he appears so anxious so we can further observe the patient.  Otherwise appears hemodynamically stable.  Would ambulate with steady gait.  Clinical Course as of Nov 17 546  Mon Nov 17, 2018  0211 Bladder scan showed 160 in his bladder.   [PR]  0244 Patient reassessed.  Appears much more comfortable and less anxious.  States he feels better.  States that symptoms started yesterday after he was drinking alcohol and masturbating.  States that his penis is been sore since then.  Physical exam shows no evidence of urethral discharge or hematuria.  Scrotal exam is normal. No hernia.  Bedside ultrasound shows no evidence of hydronephrosis or bladder distention.  No free fluid.  Reassuring exam overall.   [PR]  0455 Patient refusing to provide urine sample.  Refusing further diagnostic testing.  Positive bladder outlet obstruction.  Repeat abdominal exam soft benign.  Again encouraged him to right urine sample.  He agrees.    [PR]  [phone] Patient requesting to leave at this point.  Had a lengthy discussion with regarding his presentation that I still feel that urinalysis is  indicated to evaluate his complaints.  Have a low suspicion for acute emergent process however the patient is leaving without enabling us to fully evaluate for underlying medical illness or emergent condition.  Patient will be signing out AGAINST MEDICAL ADVICE.  Patient demonstrates understanding that he can return to the ER at any time for further evaluation.  He also demonstrates understanding that leaving without any diagnostic testing limits our ability to exclude a potentially life-threatening or debilitating illness.  She is alert and oriented and does appear to have capacity to make this decision.  He is able to ambulate with steady gait.    [PR]    Clinical Course User Index [PR] Willy Eddyobinson, Shandora Koogler, MD    The patient was evaluated in Emergency Department today for the symptoms described in the history of present illness. He/she was evaluated in the context of the global COVID-19 pandemic, which necessitated consideration that the patient might be at risk for infection with the SARS-CoV-2 virus that causes COVID-19. Institutional protocols and algorithms that pertain to the evaluation of patients at risk for COVID-19 are in a state of rapid change based on information released by regulatory bodies including the CDC and federal and state organizations. These policies and algorithms were followed during the patient's care in the ED.  As part of my medical decision making, I reviewed the following data within the electronic MEDICAL RECORD NUMBER Nursing notes reviewed and incorporated, Labs reviewed, notes from prior ED  visits and [place] Controlled Substance Database   ____________________________________________   FINAL CLINICAL IMPRESSION(S) / ED DIAGNOSES  Final diagnoses:  Dysuria      NEW MEDICATIONS STARTED DURING THIS VISIT:  New Prescriptions   No medications on file     Note:  This document was prepared using Dragon voice recognition software and may include unintentional dictation  errors.    Willy Eddy, MD 11/17/18 [phone]

## 2018-11-17 NOTE — ED Notes (Signed)
This EDT preformed a bladder scan on this pt, which resulted in of urine in pt bladder, RN and MD notified

## 2018-11-17 NOTE — ED Notes (Signed)
Pt asked for urine sample pt stated that he could not provided one at this time.

## 2018-11-17 NOTE — ED Notes (Signed)
Pt asked again about urine sample, he stated that he could not give one. Pt asked if a in and out cath could be done, he refused. Dr. Roxan Hockey made aware.

## 2019-12-06 IMAGING — DX DG HAND COMPLETE 3+V*R*
1 series · 3 of 3 positions shown · non-contrast
Comparison: None.

CLINICAL DATA: Lawnmower injury.  Finger lacerations.

EXAM:
RIGHT HAND - COMPLETE 3+ VIEW

[Series 1: hand · 0.14mm/px · 3 of 3 slices shown]
[im 1/3]
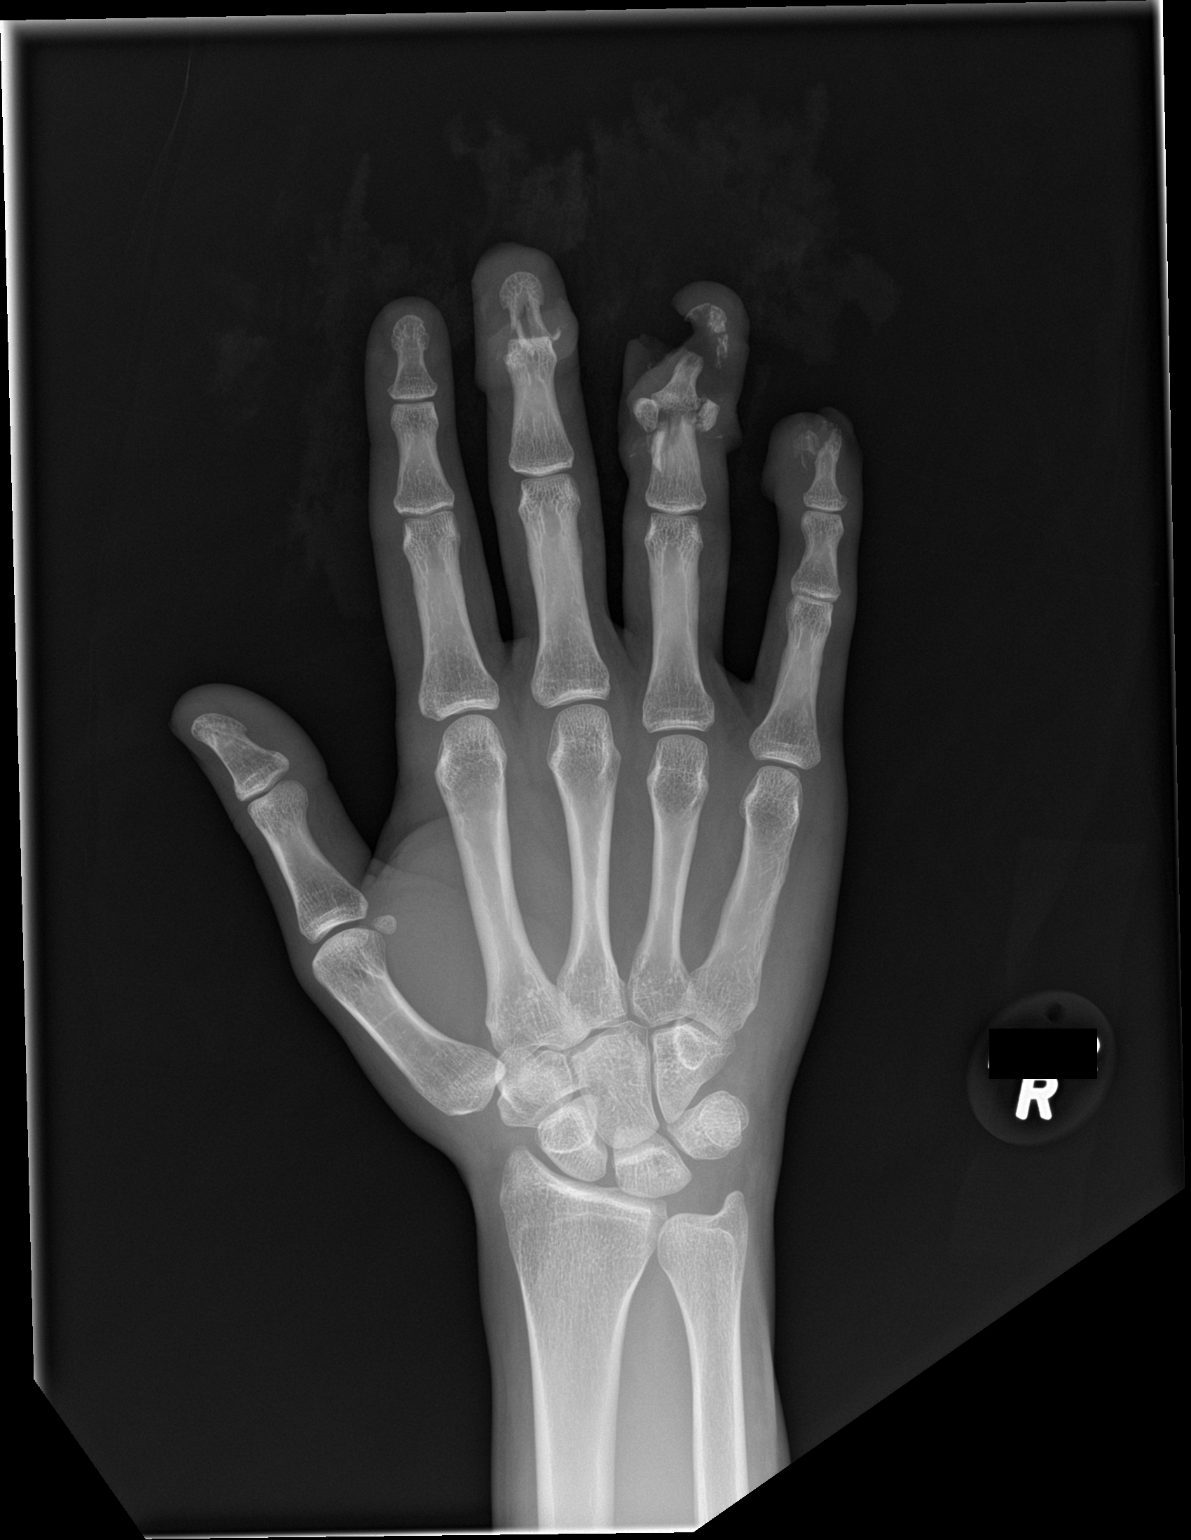
[im 2/3]
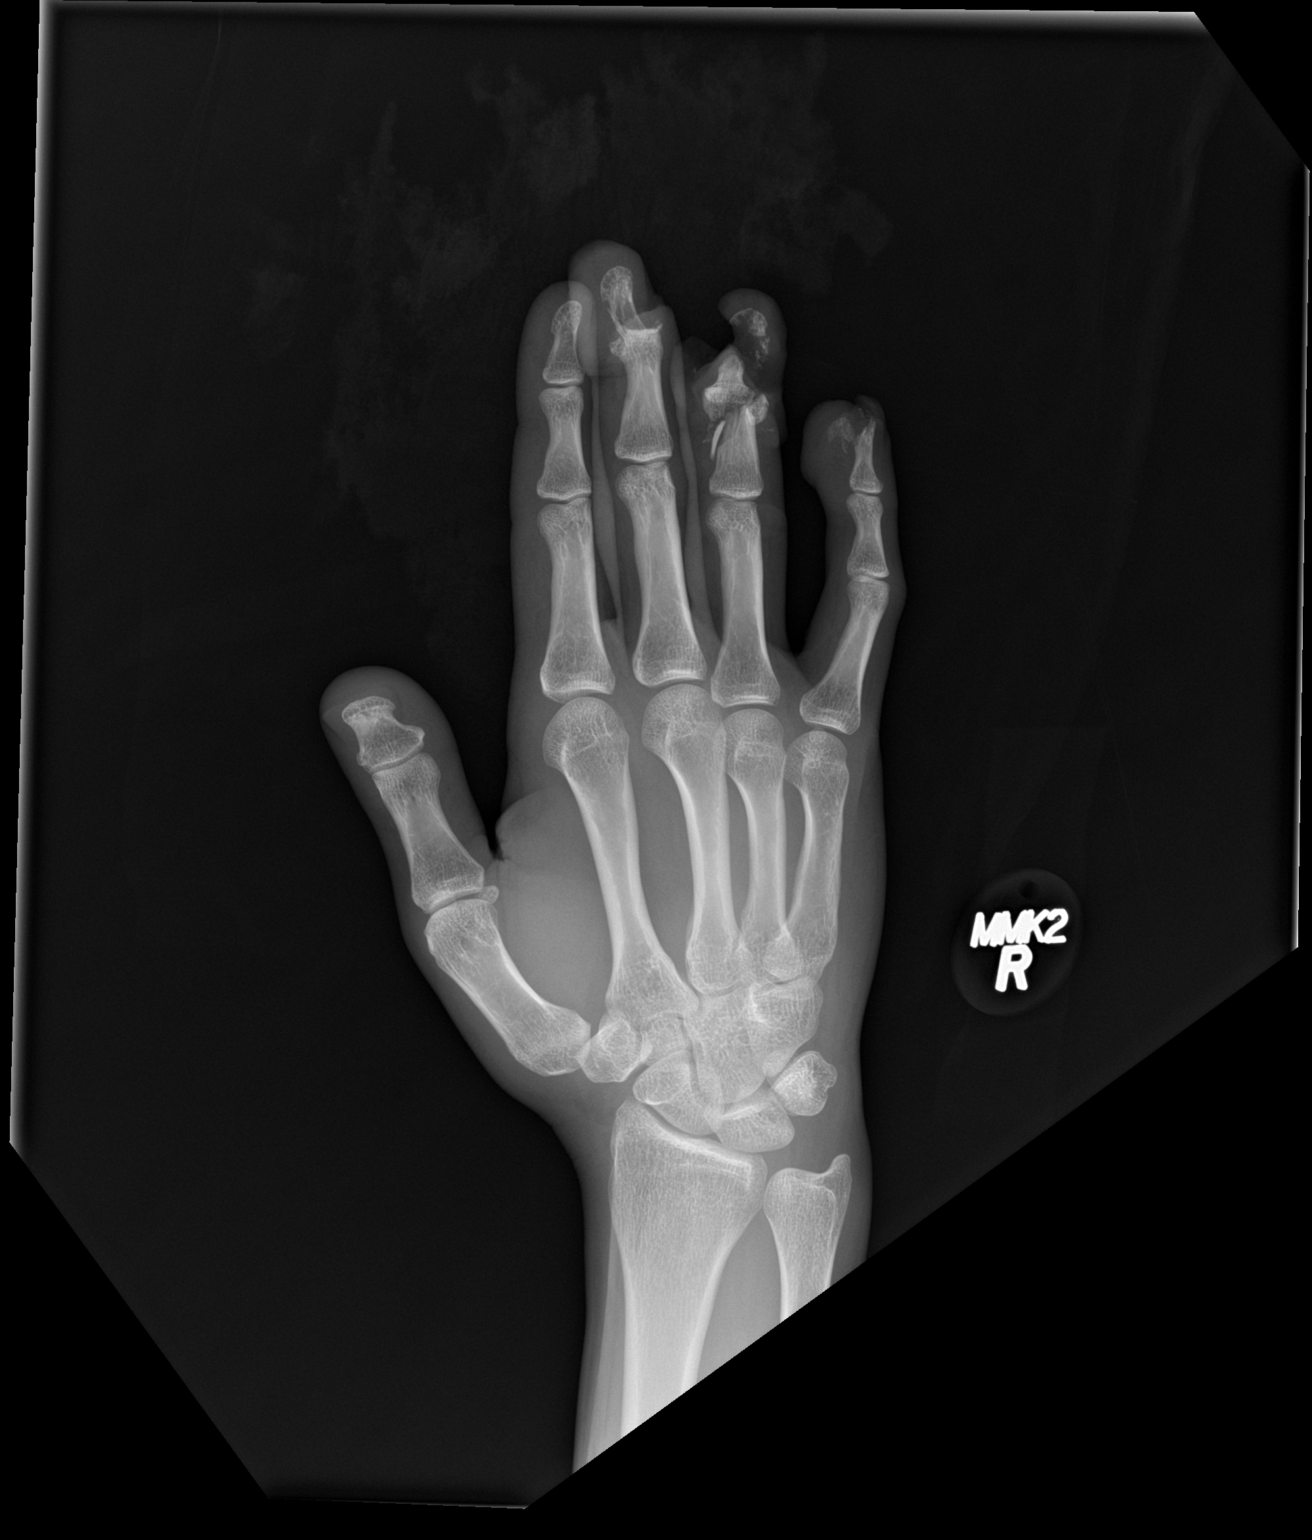
[im 3/3]
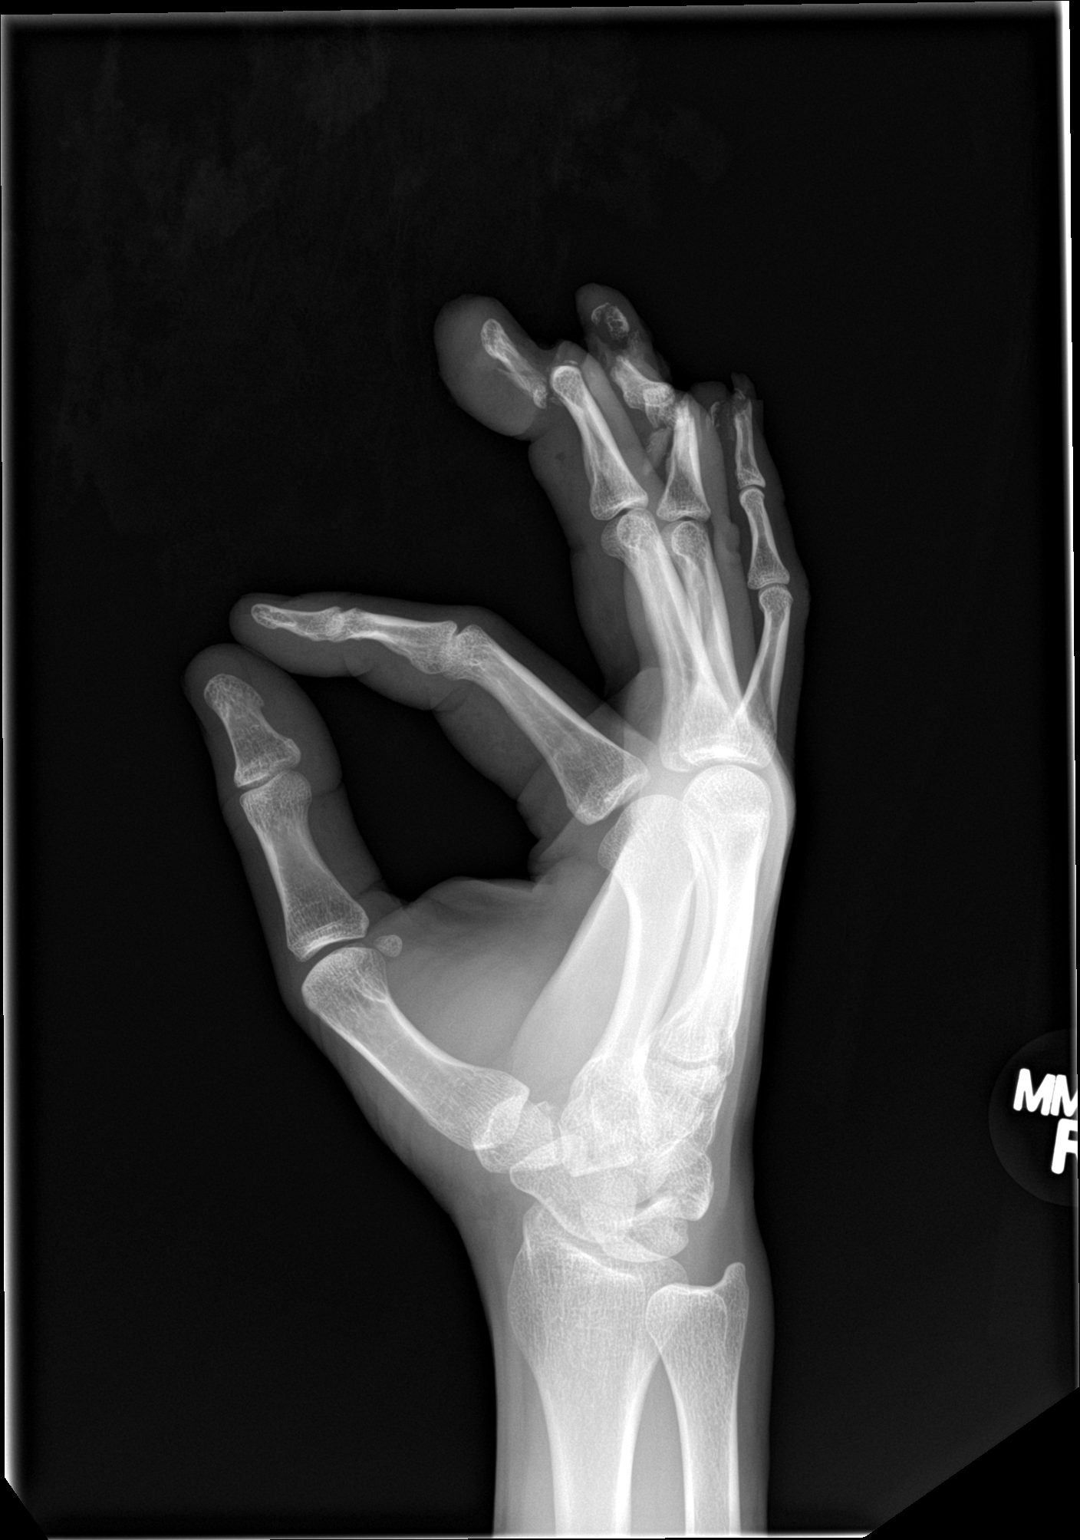

[3 of 3 positions shown; findings below may reference images not displayed]

FINDINGS: There are partial amputation injuries through the 3rd, 4th and 5th
digits. The long finger injury involves the base of the distal
phalanx and demonstrates intra-articular extension with resulting
anterior dislocation. The middle phalanx appears intact. The ring
finger injury involves the head of the middle phalanx and the distal
phalanx. The small finger injury involves only the tip of the distal
phalanx. There is significant soft tissue injury, especially in the
ring finger. No foreign bodies are seen.
IMPRESSION: Partial amputation injuries of the 3rd, 4th and 5th digits as
described, most severe in the ring finger.

## 2021-05-03 ENCOUNTER — Other Ambulatory Visit: Payer: Self-pay

## 2021-05-03 ENCOUNTER — Ambulatory Visit
Admission: EM | Admit: 2021-05-03 | Discharge: 2021-05-03 | Disposition: A | Discharge status: Home / Self Care | Payer: 59 | Attending: Physician Assistant | Admitting: Physician Assistant

## 2021-05-03 DIAGNOSIS — Z20822 Contact with and (suspected) exposure to covid-19: Secondary | ICD-10-CM

## 2021-05-03 DIAGNOSIS — J069 Acute upper respiratory infection, unspecified: Secondary | ICD-10-CM | POA: Diagnosis not present

## 2021-05-03 NOTE — ED Provider Notes (Signed)
[facility]    CSN: [id number] Arrival date & time: 05/03/21  1117      History   Chief Complaint Chief Complaint  Patient presents with   Cough    HPI Kris No is a 35 y.o. male.   Patient here today for evaluation of cough that has had for the last 2 days.  He reports that he did have a sore throat about a week ago but this is resolved.  He denies any fever or chills.  Denies any nausea, vomiting or diarrhea.  He has not tried any treatment for symptoms.  The history is provided by the patient.   Past Medical History:  Diagnosis Date   ADD (attention deficit disorder)     There are no problems to display for this patient.   Past Surgical History:  Procedure Laterality Date   PERCUTANEOUS PINNING Right 02/28/2018   Procedure: REPAIR RIGHT OPEN FRACTURES, LONG AND SMALL FINGER, PLUS ANY NECESSARY PROCEDURES;  Surgeon: Dairl Ponder, MD;  Location: [facility] OR;  Service: Orthopedics;  Laterality: Right;       Home Medications    Prior to Admission medications   Medication Sig Start Date End Date Taking? Authorizing Provider  amphetamine-dextroamphetamine (ADDERALL) 20 MG tablet Take 20 mg by mouth 3 (three) times daily.    [provider]  loratadine (CLARITIN) 10 MG tablet Take 10 mg by mouth daily.    [provider]    Family History History reviewed. No pertinent family history.  Social History Social History   Tobacco Use   Smoking status: Never   Smokeless tobacco: Never   Tobacco comments:    "just quit vaping"  Substance Use Topics   Alcohol use: Yes   Drug use: Never     Allergies   Patient has no known allergies.   Review of Systems Review of Systems  Constitutional:  Negative for chills and fever.  HENT:  Positive for congestion. Negative for ear pain and sore throat.   Eyes:  Negative for discharge and redness.  Respiratory:  Positive for cough. Negative for shortness of breath.   Gastrointestinal:   Negative for abdominal pain, nausea and vomiting.    Physical Exam Triage Vital Signs ED Triage Vitals  Enc Vitals Group     BP      Pulse      Resp      Temp      Temp src      SpO2      Weight      Height      Head Circumference      Peak Flow      Pain Score      Pain Loc      Pain Edu?      Excl. in GC?    No data found.  Updated Vital Signs BP (!) 153/77 (BP Location: Left Arm)   Pulse 91   Temp 97.7 F (36.5 C) (Oral)   Resp 18   SpO2 96%      Physical Exam Vitals and nursing note reviewed.  Constitutional:      General: He is not in acute distress.    Appearance: Normal appearance. He is not ill-appearing.  HENT:     Head: Normocephalic and atraumatic.     Nose: Nose normal. No congestion.     Mouth/Throat:     Mouth: Mucous membranes are moist.     Pharynx: Oropharynx is clear. No oropharyngeal exudate or posterior  oropharyngeal erythema.  Eyes:     Conjunctiva/sclera: Conjunctivae normal.  Cardiovascular:     Rate and Rhythm: Normal rate and regular rhythm.     Heart sounds: Normal heart sounds. No murmur heard. Pulmonary:     Effort: Pulmonary effort is normal. No respiratory distress.     Breath sounds: Normal breath sounds. No wheezing, rhonchi or rales.  Skin:    General: Skin is warm and dry.  Neurological:     Mental Status: He is alert.  Psychiatric:        Mood and Affect: Mood normal.        Thought Content: Thought content normal.     UC Treatments / Results  Labs (all labs ordered are listed, but only abnormal results are displayed) Labs Reviewed  NOVEL CORONAVIRUS, NAA    EKG   Radiology No results found.  Procedures Procedures (including critical care time)  Medications Ordered in UC Medications - No data to display  Initial Impression / Assessment and Plan / UC Course  I have reviewed the triage vital signs and the nursing notes.  Pertinent labs & imaging results that were available during my care of the  patient were reviewed by me and considered in my medical decision making (see chart for details).   Suspect viral cause of symptoms, will screen for COVID. Will await results for further recommendation. Encouraged follow up if symptoms fail to improve or worsen in any way. Recommend symptomatic treatment in the meantime.   Final Clinical Impressions(s) / UC Diagnoses   Final diagnoses:  Encounter for screening laboratory testing for COVID-19 virus  Viral upper respiratory tract infection   Discharge Instructions   None    ED Prescriptions   None    PDMP not reviewed this encounter.   Tomi Bamberger, PA-C 05/03/21 1212

## 2021-05-03 NOTE — ED Triage Notes (Signed)
Pt c/o cough x2 days. States had a sore throat x1wk that has subsided. States had a neg home covid test.

## 2021-05-04 LAB — SARS-COV-2, NAA 2 DAY TAT

## 2021-05-04 LAB — NOVEL CORONAVIRUS, NAA: SARS-CoV-2, NAA: NOT DETECTED

## 2021-06-28 ENCOUNTER — Other Ambulatory Visit: Payer: Self-pay

## 2021-06-28 ENCOUNTER — Ambulatory Visit
Admission: EM | Admit: 2021-06-28 | Discharge: 2021-06-28 | Disposition: A | Discharge status: Home / Self Care | Payer: No Typology Code available for payment source | Attending: Physician Assistant | Admitting: Physician Assistant

## 2021-06-28 ENCOUNTER — Encounter: Payer: Self-pay | Admitting: Emergency Medicine

## 2021-06-28 DIAGNOSIS — U071 COVID-19: Secondary | ICD-10-CM | POA: Diagnosis not present

## 2021-06-28 MED ORDER — PREDNISONE 20 MG PO TABS: 40.0000 mg | ORAL_TABLET | Freq: Every day | ORAL | 0 refills | Status: AC

## 2021-06-28 NOTE — ED Triage Notes (Signed)
Says he's been intermittently sick over the last 6 weeks, took covid tests then and resulted negative. Two weeks ago began feeling bad, took two covid tests on 12/6 that resulted positive. Continues to have intermittent sore throat, cough, and has been swishing mouth with chloraseptic solution over the last week. Wants to know if theres anything we can give him to improve symptoms/give work note.

## 2021-06-28 NOTE — ED Provider Notes (Signed)
[facility]    CSN: [id number] Arrival date & time: 06/28/21  1313      History   Chief Complaint Chief Complaint  Patient presents with   Covid Positive - needs work note    HPI Damon Dunlap is a 35 y.o. male.   Patient here today for evaluation of continued upper respiratory symptoms after being diagnosed with COVID about 8 days ago.  He states that he went intermittent sore throat that is significant as well as shortness of breath.  He has not had recent fever.  He has tried over-the-counter medication without significant relief.  The history is provided by the patient.   Past Medical History:  Diagnosis Date   ADD (attention deficit disorder)     There are no problems to display for this patient.   Past Surgical History:  Procedure Laterality Date   PERCUTANEOUS PINNING Right 02/28/2018   Procedure: REPAIR RIGHT OPEN FRACTURES, LONG AND SMALL FINGER, PLUS ANY NECESSARY PROCEDURES;  Surgeon: Dairl Ponder, MD;  Location: [facility] OR;  Service: Orthopedics;  Laterality: Right;       Home Medications    Prior to Admission medications   Medication Sig Start Date End Date Taking? Authorizing Provider  predniSONE (DELTASONE) 20 MG tablet Take 2 tablets (40 mg total) by mouth daily with breakfast for 5 days. 06/28/21 07/03/21 Yes Tomi Bamberger, PA-C  amphetamine-dextroamphetamine (ADDERALL) 20 MG tablet Take 20 mg by mouth 3 (three) times daily.    [provider]  loratadine (CLARITIN) 10 MG tablet Take 10 mg by mouth daily.    [provider]    Family History History reviewed. No pertinent family history.  Social History Social History   Tobacco Use   Smoking status: Never   Smokeless tobacco: Never   Tobacco comments:    "just quit vaping"  Substance Use Topics   Alcohol use: Yes   Drug use: Never     Allergies   Patient has no known allergies.   Review of Systems Review of Systems  Constitutional:  Negative for  chills and fever.  HENT:  Positive for congestion, ear pain and sore throat.   Eyes:  Negative for discharge and redness.  Respiratory:  Positive for cough and shortness of breath.   Gastrointestinal:  Negative for abdominal pain, diarrhea, nausea and vomiting.    Physical Exam Triage Vital Signs ED Triage Vitals  Enc Vitals Group     BP 06/28/21 1524 120/81     Pulse Rate 06/28/21 1524 91     Resp 06/28/21 1524 16     Temp 06/28/21 1524 98.1 F (36.7 C)     Temp Source 06/28/21 1524 Oral     SpO2 06/28/21 1524 96 %     Weight --      Height --      Head Circumference --      Peak Flow --      Pain Score 06/28/21 1502 0     Pain Loc --      Pain Edu? --      Excl. in GC? --    No data found.  Updated Vital Signs BP 120/81 (BP Location: Right Arm)    Pulse 91    Temp 98.1 F (36.7 C) (Oral)    Resp 16    SpO2 96%     Physical Exam Vitals and nursing note reviewed.  Constitutional:      General: He is not in acute distress.  Appearance: Normal appearance. He is not ill-appearing.  HENT:     Head: Normocephalic and atraumatic.     Right Ear: Tympanic membrane normal.     Left Ear: Tympanic membrane normal.     Nose: Congestion present.     Mouth/Throat:     Mouth: Mucous membranes are moist.     Pharynx: Oropharynx is clear. No oropharyngeal exudate or posterior oropharyngeal erythema.  Eyes:     Conjunctiva/sclera: Conjunctivae normal.  Cardiovascular:     Rate and Rhythm: Normal rate and regular rhythm.     Heart sounds: Normal heart sounds. No murmur heard. Pulmonary:     Effort: Pulmonary effort is normal. No respiratory distress.     Breath sounds: Normal breath sounds. No wheezing, rhonchi or rales.  Skin:    General: Skin is warm and dry.  Neurological:     Mental Status: He is alert.  Psychiatric:        Mood and Affect: Mood normal.        Thought Content: Thought content normal.     UC Treatments / Results  Labs (all labs ordered are listed,  but only abnormal results are displayed) Labs Reviewed - No data to display  EKG   Radiology No results found.  Procedures Procedures (including critical care time)  Medications Ordered in UC Medications - No data to display  Initial Impression / Assessment and Plan / UC Course  I have reviewed the triage vital signs and the nursing notes.  Pertinent labs & imaging results that were available during my care of the patient were reviewed by me and considered in my medical decision making (see chart for details).     Suspect most likely symptoms are related to COVID, and discussed same with patient.  We will trial steroids in hopes to improve pharyngitis as well as shortness of breath questionably related to developing bronchitis.  Recommend follow-up if symptoms fail to improve or worsen.  Final Clinical Impressions(s) / UC Diagnoses   Final diagnoses:  COVID   Discharge Instructions   None    ED Prescriptions     Medication Sig Dispense Auth. Provider   predniSONE (DELTASONE) 20 MG tablet Take 2 tablets (40 mg total) by mouth daily with breakfast for 5 days. 10 tablet Tomi Bamberger, PA-C      PDMP not reviewed this encounter.   Tomi Bamberger, PA-C 06/28/21 1540
